# Patient Record
Sex: Male | Born: 1970 | Race: White | Hispanic: No | Marital: Married
Health system: Southern US, Community
[De-identification: ages and names within clinical notes are randomized; demographics above are authoritative.]

---

## 2007-01-03 ENCOUNTER — Encounter: Admission: RE | Admit: 2007-01-03 | Discharge: 2007-01-22 | Payer: Self-pay | Admitting: Orthopedic Surgery

## 2007-04-04 ENCOUNTER — Ambulatory Visit (HOSPITAL_COMMUNITY): Admission: RE | Admit: 2007-04-04 | Discharge: 2007-04-04 | Payer: Self-pay | Admitting: Orthopedic Surgery

## 2007-04-18 ENCOUNTER — Encounter: Admission: RE | Admit: 2007-04-18 | Discharge: 2007-04-18 | Payer: Self-pay | Admitting: Orthopedic Surgery

## 2007-05-07 ENCOUNTER — Encounter: Admission: RE | Admit: 2007-05-07 | Discharge: 2007-06-06 | Payer: Self-pay | Admitting: Orthopedic Surgery

## 2009-08-27 ENCOUNTER — Ambulatory Visit (HOSPITAL_COMMUNITY): Admission: RE | Admit: 2009-08-27 | Discharge: 2009-08-27 | Payer: Self-pay | Admitting: Orthopedic Surgery

## 2009-09-23 ENCOUNTER — Encounter: Admission: RE | Admit: 2009-09-23 | Discharge: 2009-10-29 | Payer: Self-pay | Admitting: Orthopedic Surgery

## 2009-11-01 ENCOUNTER — Encounter: Admission: RE | Admit: 2009-11-01 | Discharge: 2010-01-30 | Payer: Self-pay | Admitting: Orthopedic Surgery

## 2010-08-12 ENCOUNTER — Encounter: Admission: RE | Admit: 2010-08-12 | Discharge: 2010-08-12 | Payer: Self-pay | Admitting: Otolaryngology

## 2010-11-10 IMAGING — CT CT TEMPORAL BONES W/O CM
3 of 5 series · 16 of 30 positions shown, 18 images · non-contrast
Comparison: None

CLINICAL DATA: Suspected attic cholesteatoma.  Recurrent infection.

CT TEMPORAL BONES WITHOUT CONTRAST
TECHNIQUE: Axial and coronal plane CT imaging of the petrous
temporal bones was performed with thin-collimation image
reconstruction.  No intravenous contrast was administered.
Multiplanar CT image reconstructions were also generated.

[Series 3: ax mag right · axial · 0.19mm/px · z∈[-10,+18]mm · 3 of 178 slices shown]
[im 45/178  bone]
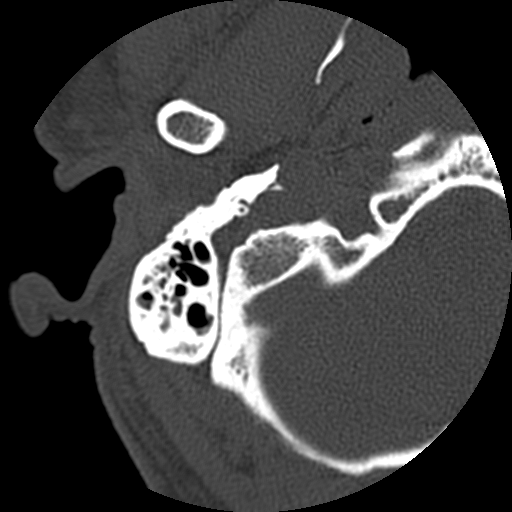
[im 89/178  bone]
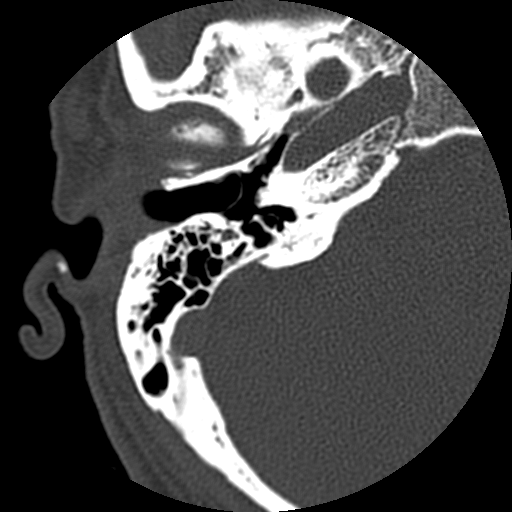
[im 133/178  bone]
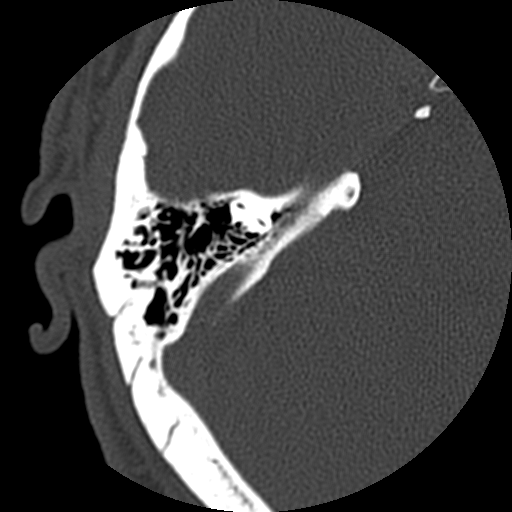

[Series 300: rt cor · coronal · 0.19mm/px · 6 of 292 slices shown]
[im 42/292  bone]
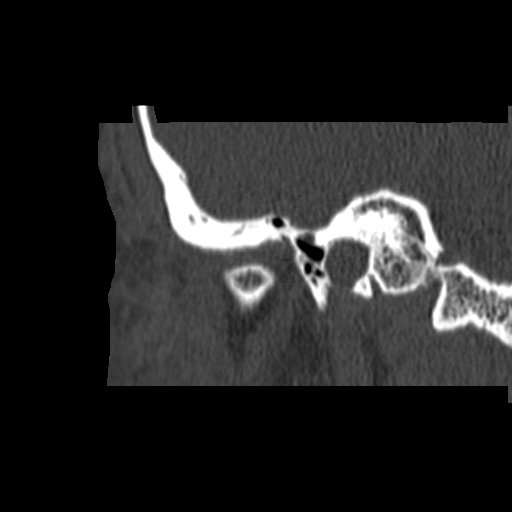
[im 84/292  bone]
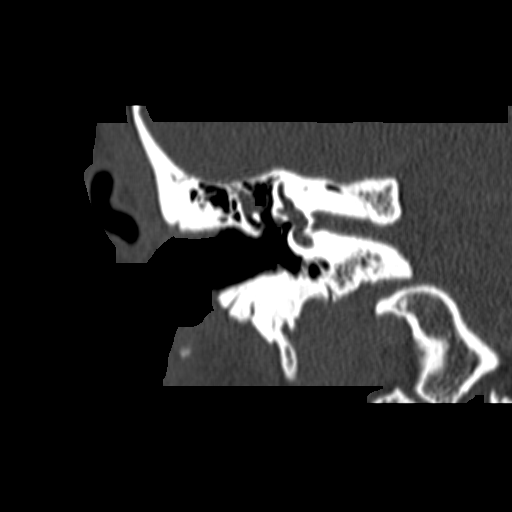
[im 125/292  bone]
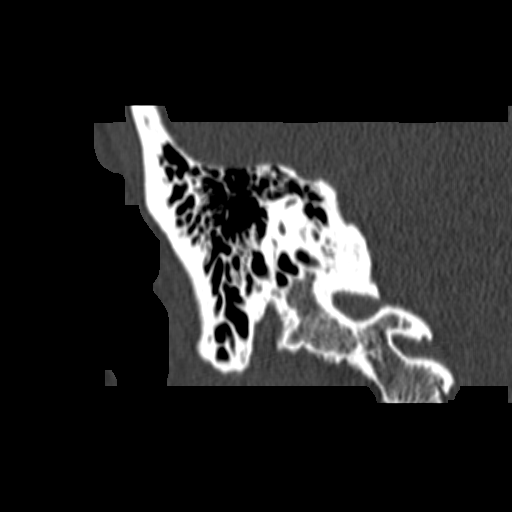
[im 167/292  bone]
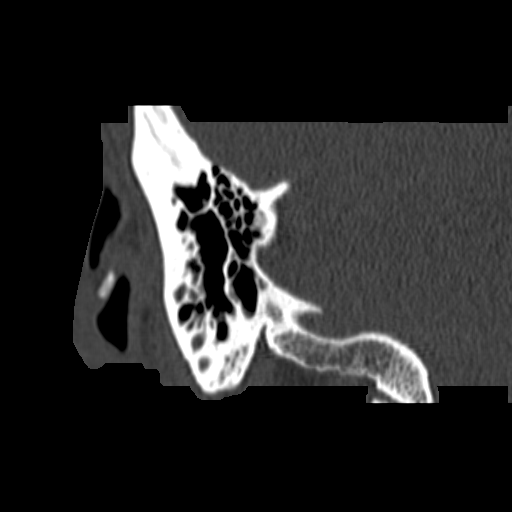
[im 208/292  bone]
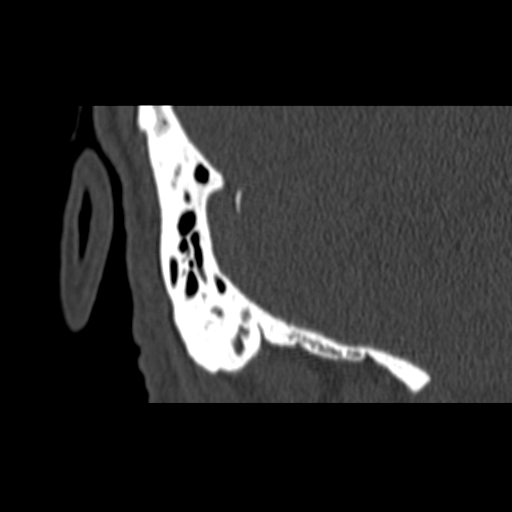
[im 250/292  bone]
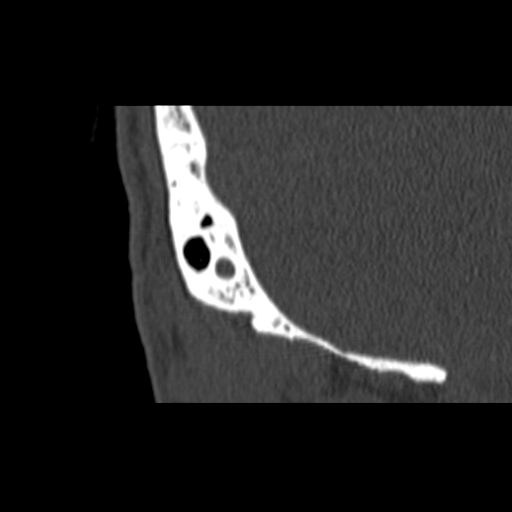

[Series 400: lt cor · coronal · 0.19mm/px · 7 of 331 slices shown, 9 images]
[im 42/331  brain]
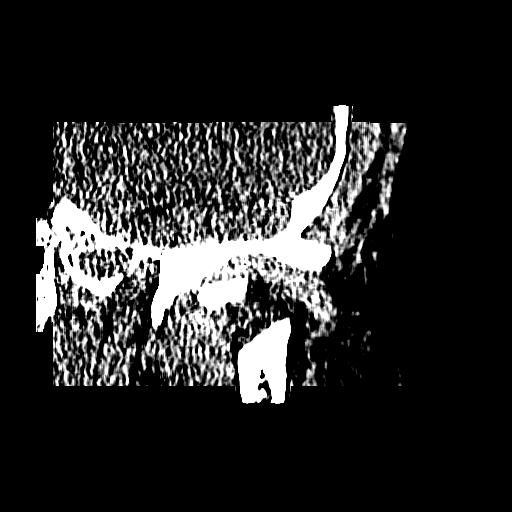
[im 42/331  bone]
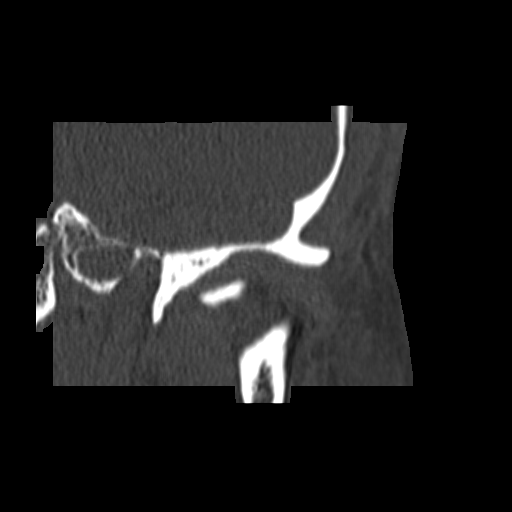
[im 83/331  bone]
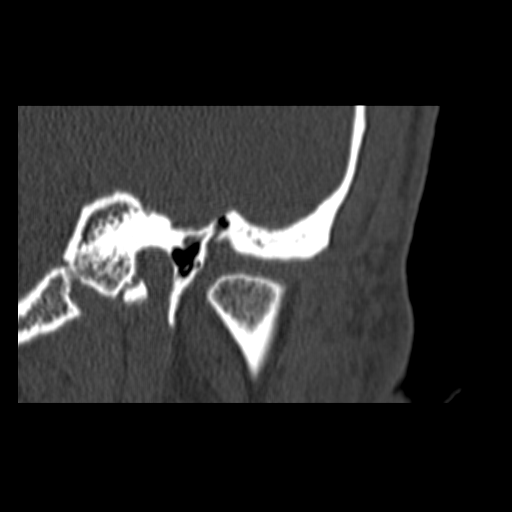
[im 124/331  bone]
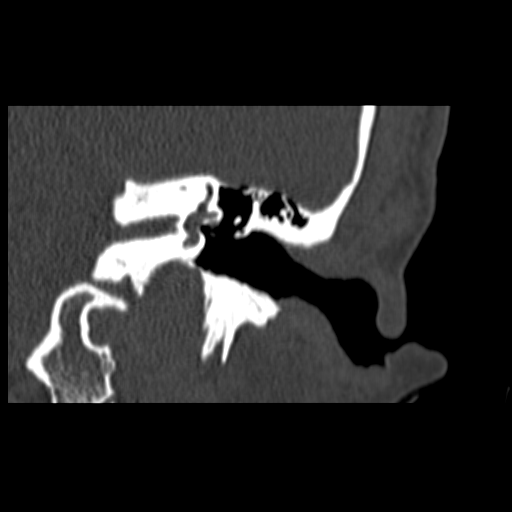
[im 166/331  bone]
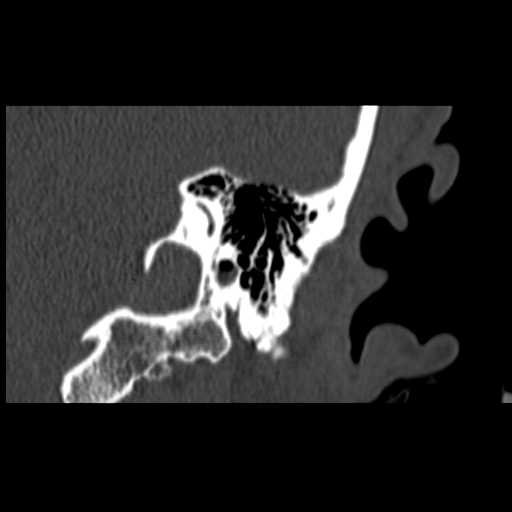
[im 207/331  brain]
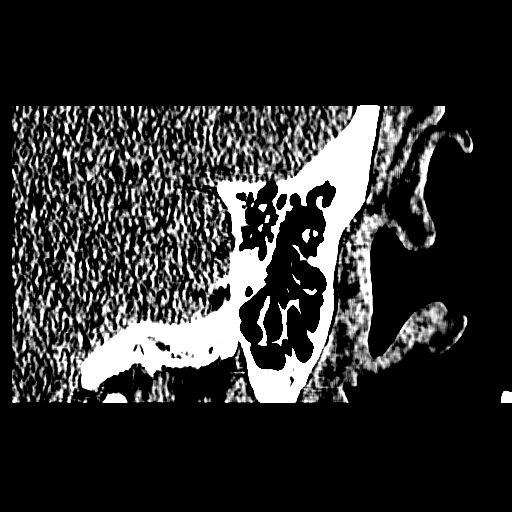
[im 207/331  bone]
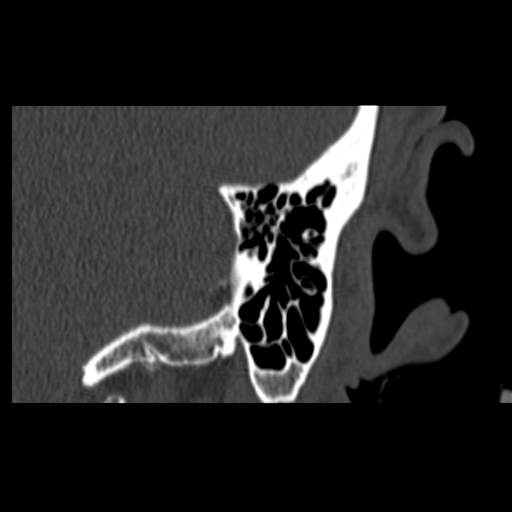
[im 248/331  bone]
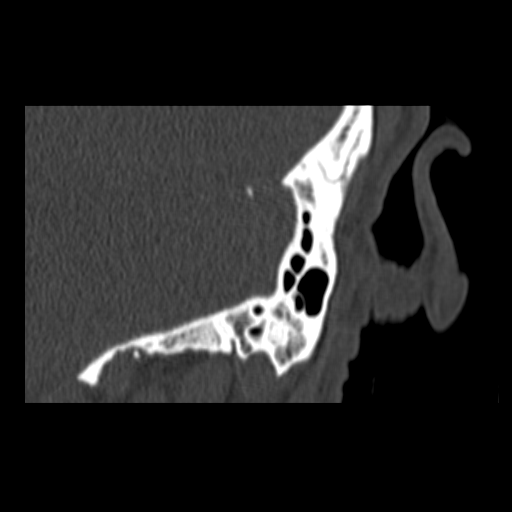
[im 289/331  bone]
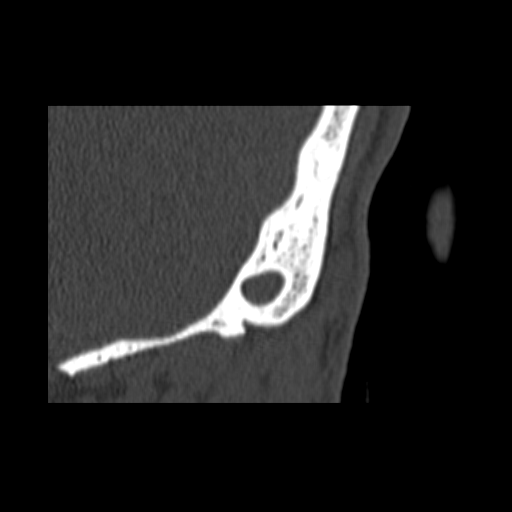

[16 of 30 positions shown; findings below may reference images not displayed]

FINDINGS: On the right, there is mass-like soft tissue density in
Prussak space.  This measures approximately 9 x 4 mm and extends
into the attic.  There is blunting and erosion of the scutum.
Erosive  changes are seen involving the malleus and incus.  There
is mild mucosal edema in the mastoid tip on the right.  The cochlea
and semicircular canals are normal.  The internal auditory canal is
normal.

On the left, the middle ear is aerated normal and the ossicles are
normal.  The mastoid sinus is well developed.  There is a small
amount of mucosal thickening in a single air cells medially.  The
cochlea and semicircular canals are normal.  Internal canals
normal.
IMPRESSION: Findings compatible with cholesteatoma in the attic on the right.
There is erosion of the ossicles on the right.

Normal middle ear on the left.  Mucosal thickening in  a solitary
air cell the left mastoid sinus.

## 2011-10-27 ENCOUNTER — Other Ambulatory Visit: Payer: Self-pay | Admitting: Otolaryngology

## 2020-03-13 ENCOUNTER — Ambulatory Visit: Payer: Self-pay | Attending: Internal Medicine

## 2020-03-13 DIAGNOSIS — Z23 Encounter for immunization: Secondary | ICD-10-CM

## 2020-03-13 NOTE — Progress Notes (Signed)
   [facility]  Name:  Lucas Reed    MRN: [id number] DOB: May 07, 1971  03/13/2020  Lucas Reed was observed post Covid-19 immunization for 15 minutes without incident. He was provided with Vaccine Information Sheet and instruction to access the V-Safe system.   Lucas Reed was instructed to call 911 with any severe reactions post vaccine: Marland Kitchen Difficulty breathing  . Swelling of face and throat  . A fast heartbeat  . A bad rash all over body  . Dizziness and weakness   Immunizations Administered    Name Date Dose VIS Date Route   Pfizer COVID-19 Vaccine 03/13/2020  3:46 PM 0.3 mL 12/25/2018 Intramuscular   Manufacturer: [employer], [employer]   Lot: [id number]   NDC: [address]-2

## 2020-04-03 ENCOUNTER — Ambulatory Visit: Payer: Self-pay | Attending: Internal Medicine

## 2020-04-03 DIAGNOSIS — Z23 Encounter for immunization: Secondary | ICD-10-CM

## 2020-04-03 NOTE — Progress Notes (Signed)
   Covid-[facility] Vaccination Clinic  Name:  Lucas Reed    MRN: [id number] DOB: [birth year]  04/03/2020  [name] was observed post Covid-19 immunization for 15 minutes without incident. He was provided with Vaccine Information Sheet and instruction to access the V-Safe system.   [name] was instructed to call 911 with any severe reactions post vaccine: Marland Kitchen Difficulty breathing  . Swelling of face and throat  . A fast heartbeat  . A bad rash all over body  . Dizziness and weakness   Immunizations Administered    Name Date Dose VIS Date Route   Pfizer COVID-19 Vaccine 04/03/2020  3:40 PM 0.3 mL 12/25/2018 Intramuscular   Manufacturer: [employer], [employer]   Lot: [id number]   NDC: [address]-0

## 2020-10-28 ENCOUNTER — Other Ambulatory Visit: Payer: BC Managed Care – PPO

## 2020-10-28 DIAGNOSIS — Z20822 Contact with and (suspected) exposure to covid-19: Secondary | ICD-10-CM

## 2020-10-29 LAB — NOVEL CORONAVIRUS, NAA: SARS-CoV-2, NAA: NOT DETECTED

## 2020-10-29 LAB — SARS-COV-2, NAA 2 DAY TAT
